# Patient Record
Sex: Male | Born: 1950 | Race: White | Hispanic: No | Marital: Married
Health system: Southern US, Community
[De-identification: ages and names within clinical notes are randomized; demographics above are authoritative.]

---

## 2008-08-04 ENCOUNTER — Encounter: Admission: RE | Admit: 2008-08-04 | Discharge: 2008-08-04 | Payer: Self-pay

## 2011-01-02 IMAGING — CR DG CHEST 2V
2 series · 2 of 2 positions shown · non-contrast
Comparison: None

CLINICAL DATA: Pre-employment physical.

CHEST - 2 VIEW

[w chest pa]
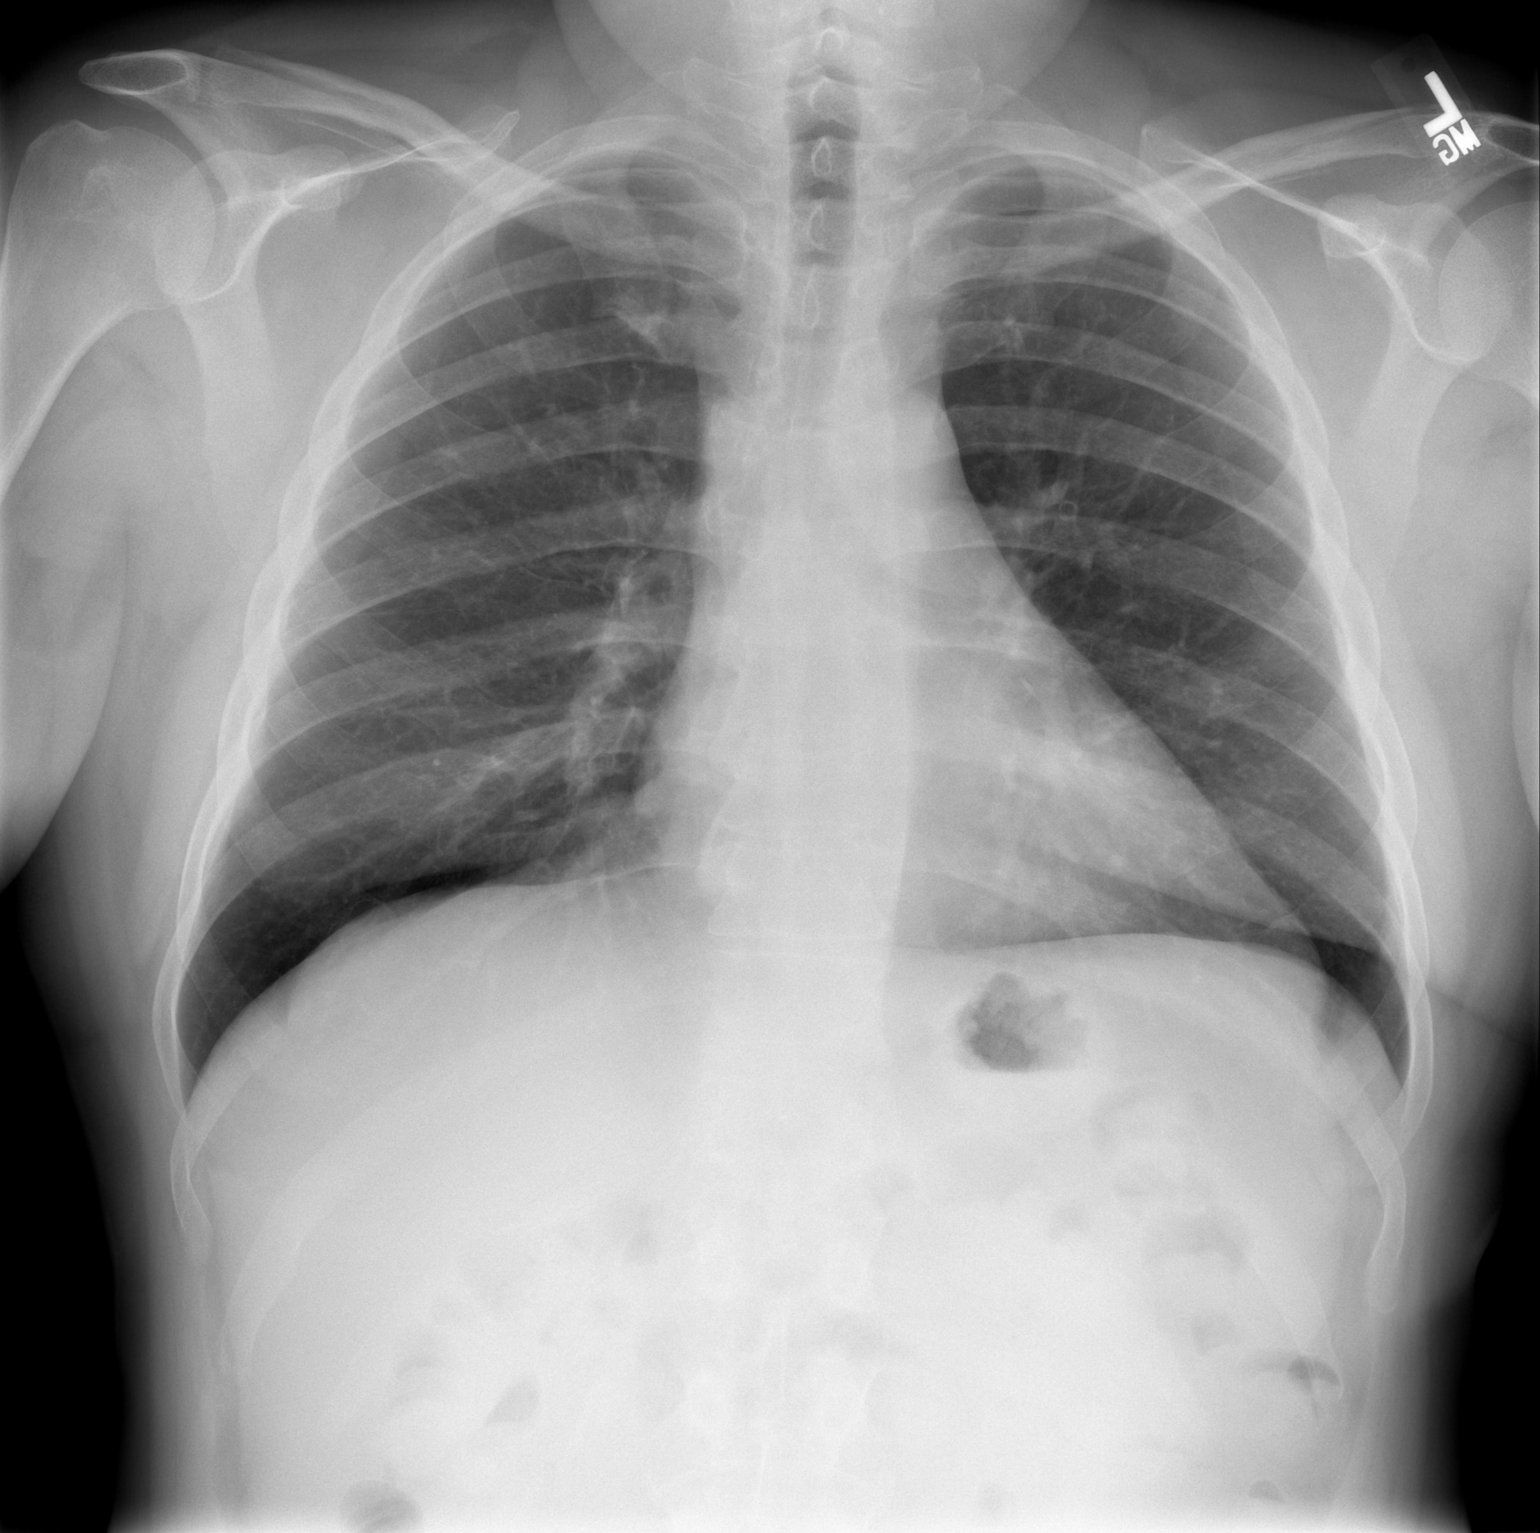

[w chest lat]
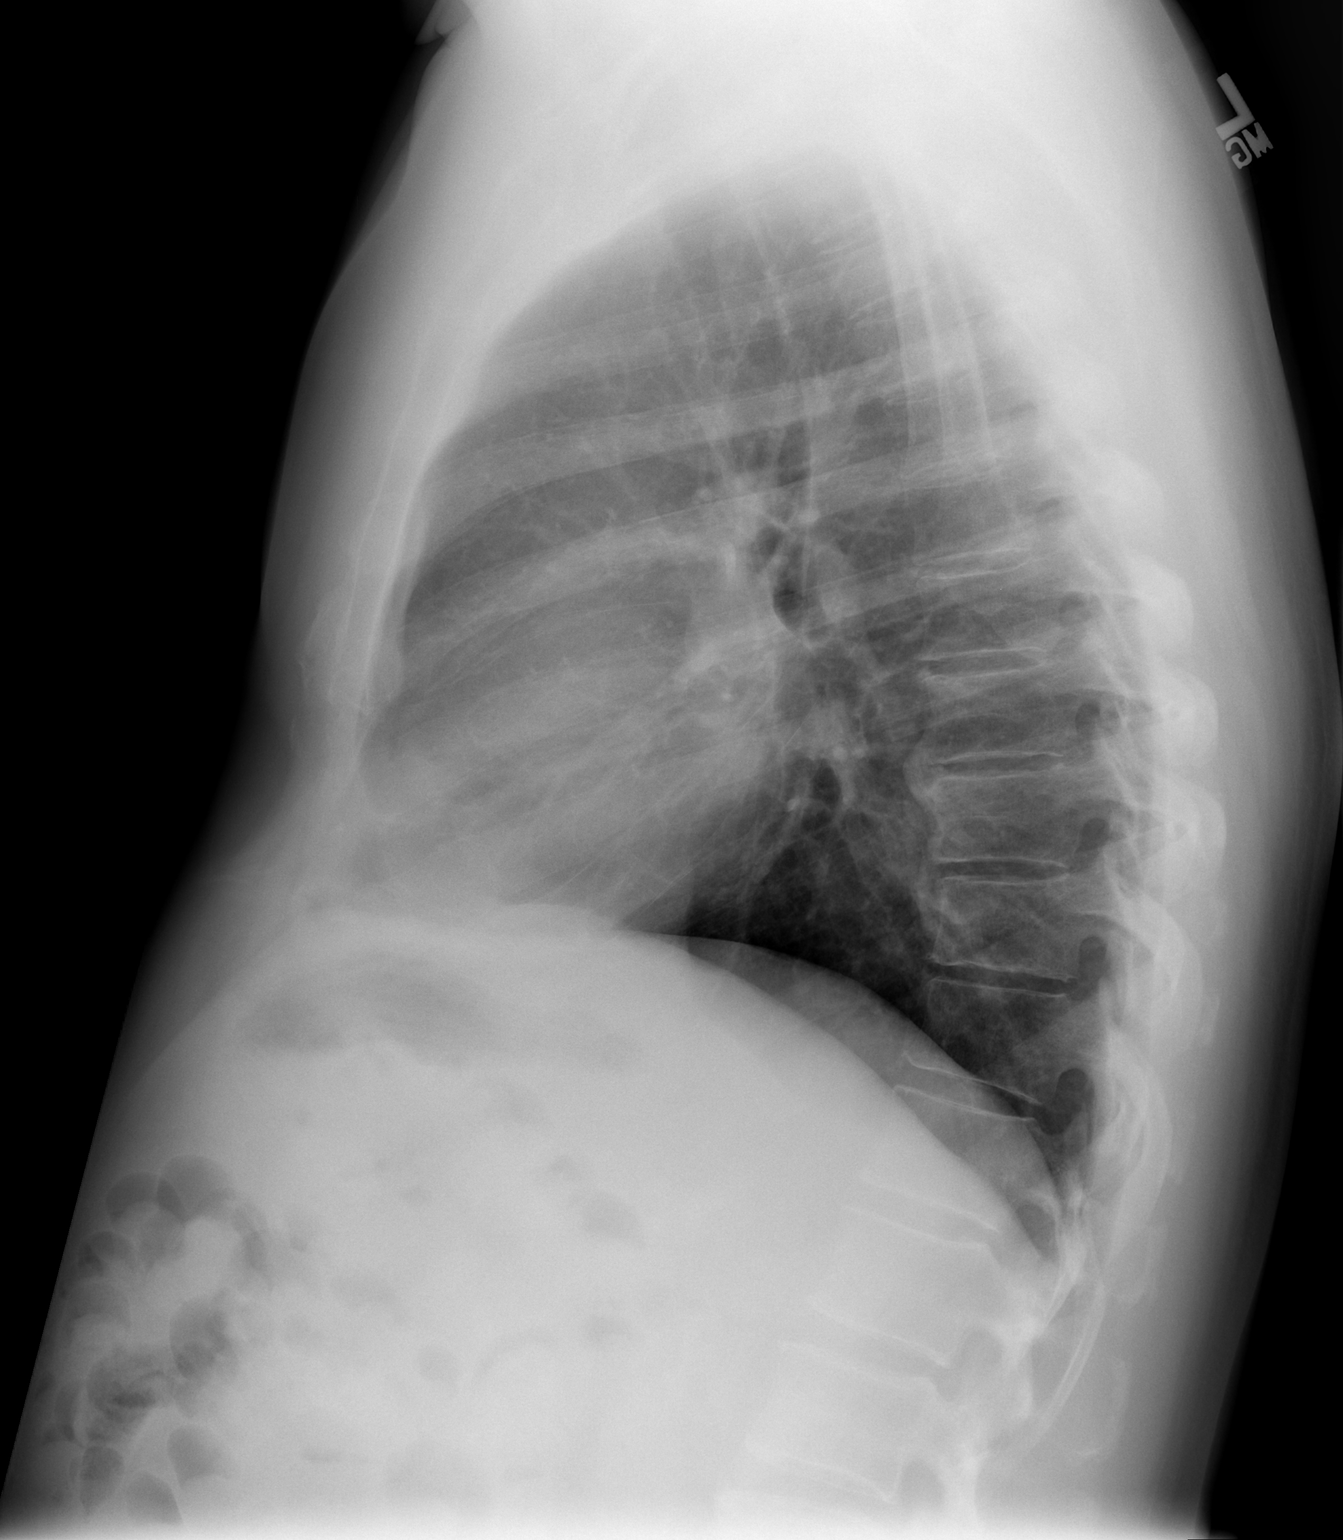

[2 of 2 positions shown; findings below may reference images not displayed]

FINDINGS: The heart size and mediastinal contours are within
normal limits.  Both lungs are clear.  The visualized skeletal
structures are unremarkable.
IMPRESSION: No active cardiopulmonary disease.

## 2016-08-26 ENCOUNTER — Ambulatory Visit (INDEPENDENT_AMBULATORY_CARE_PROVIDER_SITE_OTHER): Payer: Medicare Other | Admitting: Licensed Clinical Social Worker

## 2016-08-26 DIAGNOSIS — Z630 Problems in relationship with spouse or partner: Secondary | ICD-10-CM

## 2016-08-26 DIAGNOSIS — F4321 Adjustment disorder with depressed mood: Secondary | ICD-10-CM | POA: Diagnosis not present
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
## 2016-10-28 ENCOUNTER — Ambulatory Visit (HOSPITAL_COMMUNITY): Payer: Medicare Other | Admitting: Licensed Clinical Social Worker
